# Patient Record
Sex: Male | Born: 1980 | Race: White | Hispanic: No | Marital: Single | Smoking: Never smoker
Health system: Southern US, Community
[De-identification: ages and names within clinical notes are randomized; demographics above are authoritative.]

---

## 2017-09-14 ENCOUNTER — Emergency Department (HOSPITAL_BASED_OUTPATIENT_CLINIC_OR_DEPARTMENT_OTHER): Payer: Commercial Managed Care - PPO

## 2017-09-14 ENCOUNTER — Emergency Department (HOSPITAL_BASED_OUTPATIENT_CLINIC_OR_DEPARTMENT_OTHER)
Admission: EM | Admit: 2017-09-14 | Discharge: 2017-09-14 | Disposition: A | Discharge status: Home / Self Care | Payer: Commercial Managed Care - PPO | Attending: Emergency Medicine | Admitting: Emergency Medicine

## 2017-09-14 ENCOUNTER — Encounter (HOSPITAL_BASED_OUTPATIENT_CLINIC_OR_DEPARTMENT_OTHER): Payer: Self-pay | Admitting: Emergency Medicine

## 2017-09-14 DIAGNOSIS — Y9389 Activity, other specified: Secondary | ICD-10-CM | POA: Diagnosis not present

## 2017-09-14 DIAGNOSIS — M79642 Pain in left hand: Secondary | ICD-10-CM | POA: Insufficient documentation

## 2017-09-14 DIAGNOSIS — S20312A Abrasion of left front wall of thorax, initial encounter: Secondary | ICD-10-CM | POA: Diagnosis not present

## 2017-09-14 DIAGNOSIS — M791 Myalgia, unspecified site: Secondary | ICD-10-CM | POA: Insufficient documentation

## 2017-09-14 DIAGNOSIS — Y999 Unspecified external cause status: Secondary | ICD-10-CM | POA: Diagnosis not present

## 2017-09-14 DIAGNOSIS — S60811A Abrasion of right wrist, initial encounter: Secondary | ICD-10-CM | POA: Diagnosis not present

## 2017-09-14 DIAGNOSIS — S80211A Abrasion, right knee, initial encounter: Secondary | ICD-10-CM | POA: Insufficient documentation

## 2017-09-14 DIAGNOSIS — M25531 Pain in right wrist: Secondary | ICD-10-CM

## 2017-09-14 DIAGNOSIS — Y92410 Unspecified street and highway as the place of occurrence of the external cause: Secondary | ICD-10-CM | POA: Diagnosis not present

## 2017-09-14 DIAGNOSIS — S6991XA Unspecified injury of right wrist, hand and finger(s), initial encounter: Secondary | ICD-10-CM | POA: Diagnosis present

## 2017-09-14 MED ORDER — IBUPROFEN 600 MG PO TABS: 600.0000 mg | ORAL_TABLET | Freq: Four times a day (QID) | ORAL | 0 refills | Status: AC | PRN

## 2017-09-14 MED ORDER — KETOROLAC TROMETHAMINE 30 MG/ML IJ SOLN
15.0000 mg | Freq: Once | INTRAMUSCULAR | Status: AC
  Administered 2017-09-14: 15 mg via INTRAMUSCULAR
  Filled 2017-09-14: qty 1

## 2017-09-14 NOTE — ED Notes (Signed)
Pt discharged to home with family. NAD.  
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
## 2017-11-22 ENCOUNTER — Ambulatory Visit: Payer: Commercial Managed Care - PPO | Admitting: Family Medicine

## 2017-11-22 ENCOUNTER — Ambulatory Visit (HOSPITAL_BASED_OUTPATIENT_CLINIC_OR_DEPARTMENT_OTHER)
Admission: RE | Admit: 2017-11-22 | Discharge: 2017-11-22 | Disposition: A | Discharge status: Home / Self Care | Payer: Commercial Managed Care - PPO | Source: Ambulatory Visit | Attending: Family Medicine | Admitting: Family Medicine

## 2017-11-22 ENCOUNTER — Encounter: Payer: Self-pay | Admitting: Family Medicine

## 2017-11-22 VITALS — BP 134/86 | HR 81 | Ht 69.0 in | Wt 123.0 lb

## 2017-11-22 DIAGNOSIS — S6992XA Unspecified injury of left wrist, hand and finger(s), initial encounter: Secondary | ICD-10-CM

## 2017-11-22 DIAGNOSIS — M25542 Pain in joints of left hand: Secondary | ICD-10-CM | POA: Diagnosis not present

## 2017-11-22 DIAGNOSIS — S6992XD Unspecified injury of left wrist, hand and finger(s), subsequent encounter: Secondary | ICD-10-CM | POA: Insufficient documentation
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
## 2017-11-22 NOTE — Assessment & Plan Note (Signed)
independently reviewed radiographs and small defect noted - confirmed with ultrasound and noted small avulsion fracture ulnar side of proximal phalanx.  Do not think at this point bracing would be beneficial and fragment very small - operative intervention unlikely to be helpful.  Recommended occupational therapy - he would like to try some home exercises first.  Icing, tylenol, ibuprofen if needed.  F/u in 6 weeks or prn.
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
## 2018-03-01 IMAGING — DX DG HAND COMPLETE 3+V*R*
3 series · 3 of 3 positions shown · non-contrast
Comparison: None.

CLINICAL DATA: Right wrist and hand pain.

EXAM:
RIGHT HAND - COMPLETE 3+ VIEW

[hand pa]
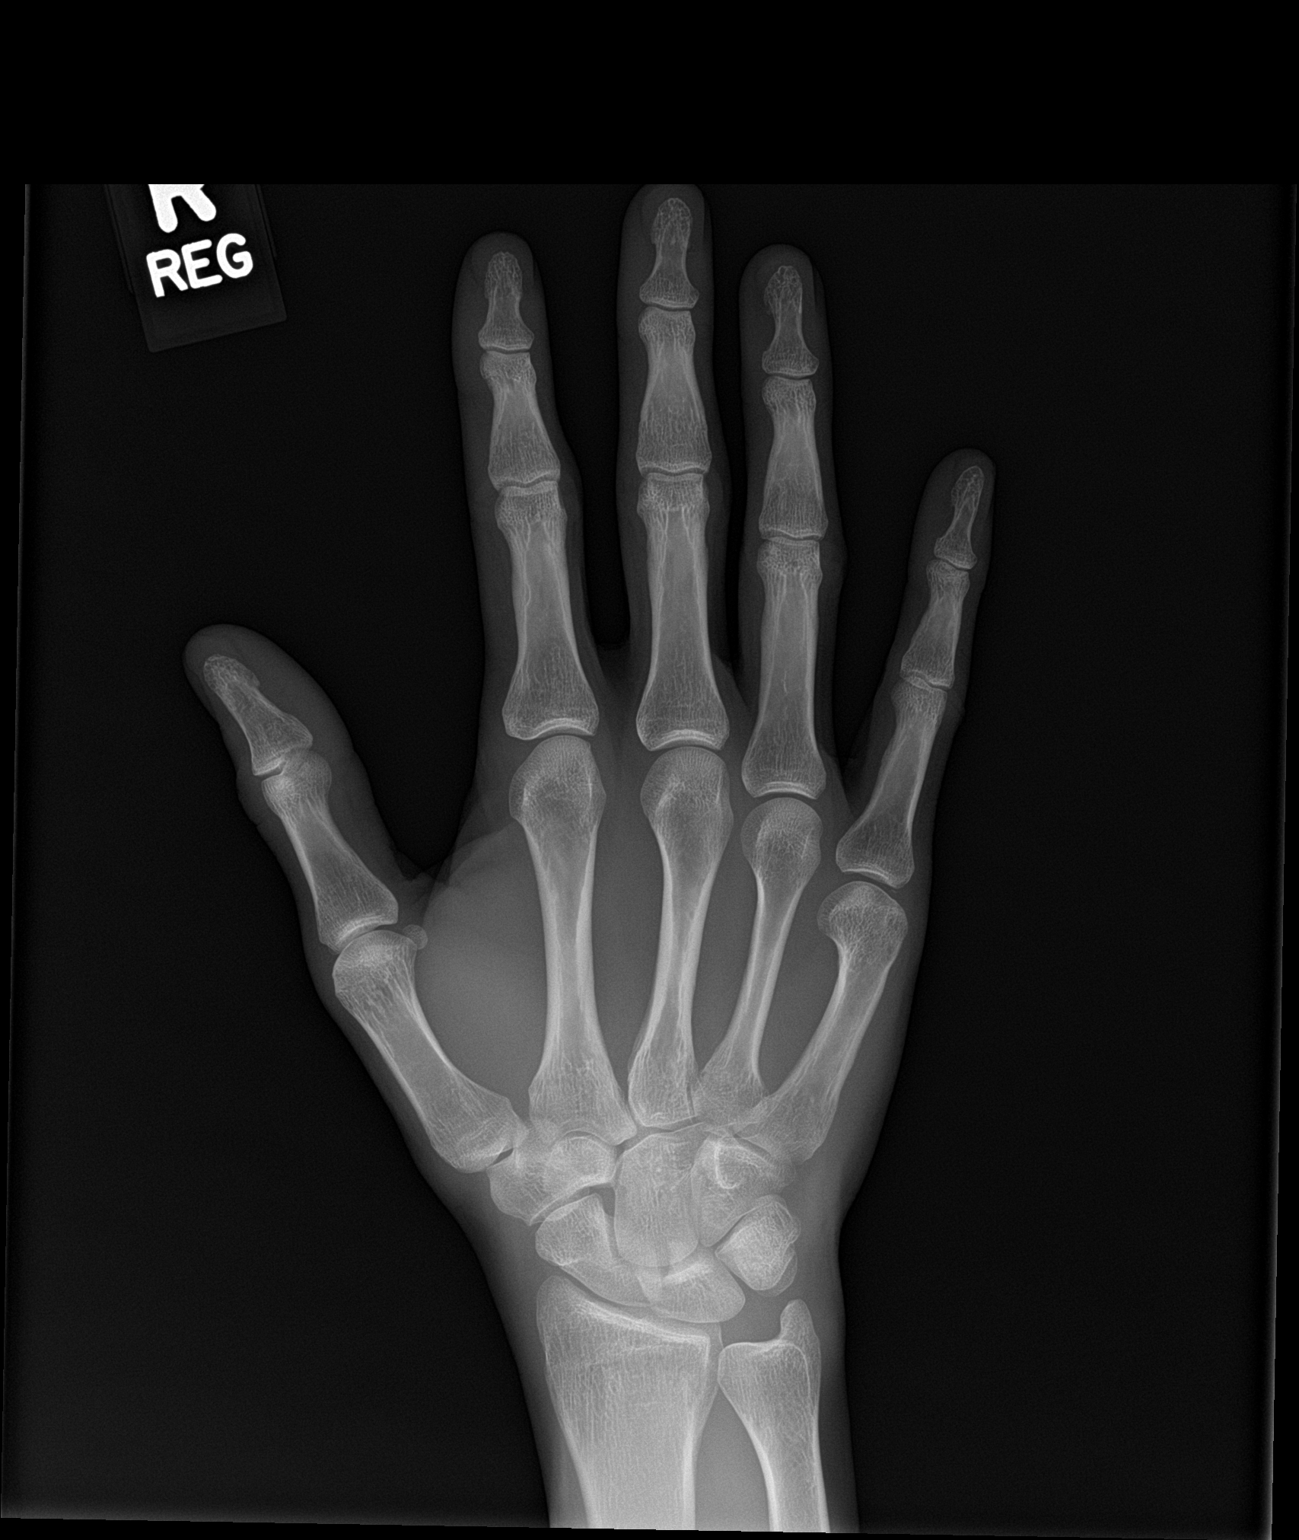

[hand obl]
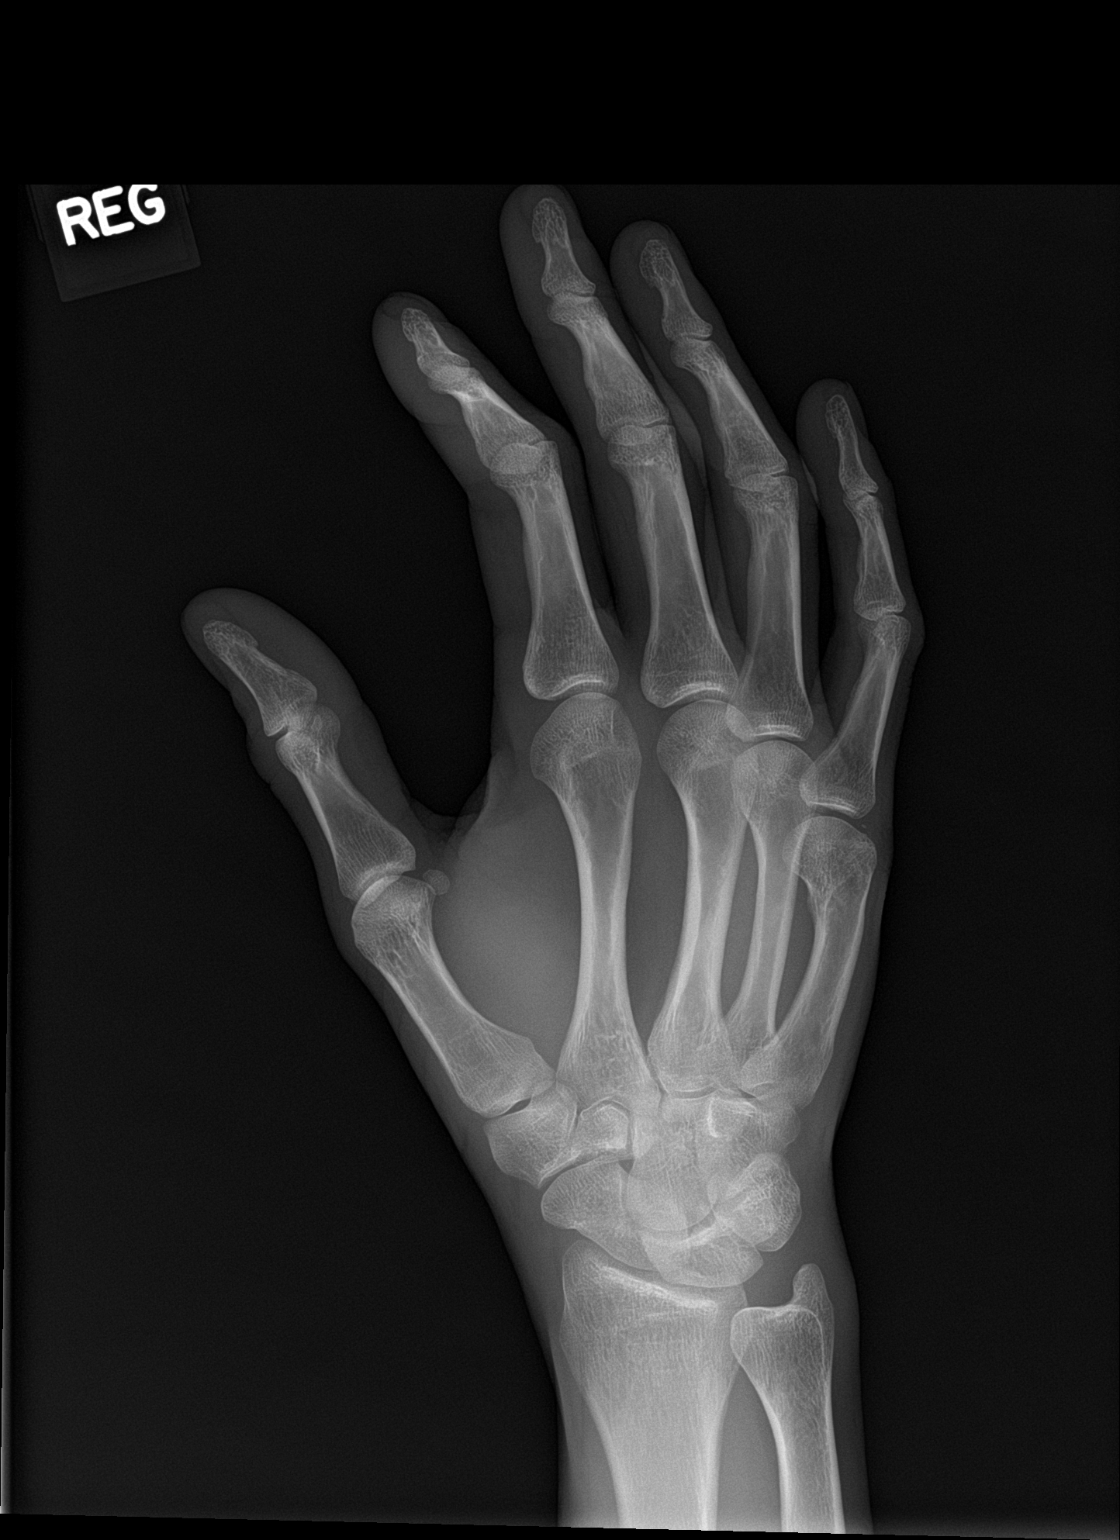

[hand lat]
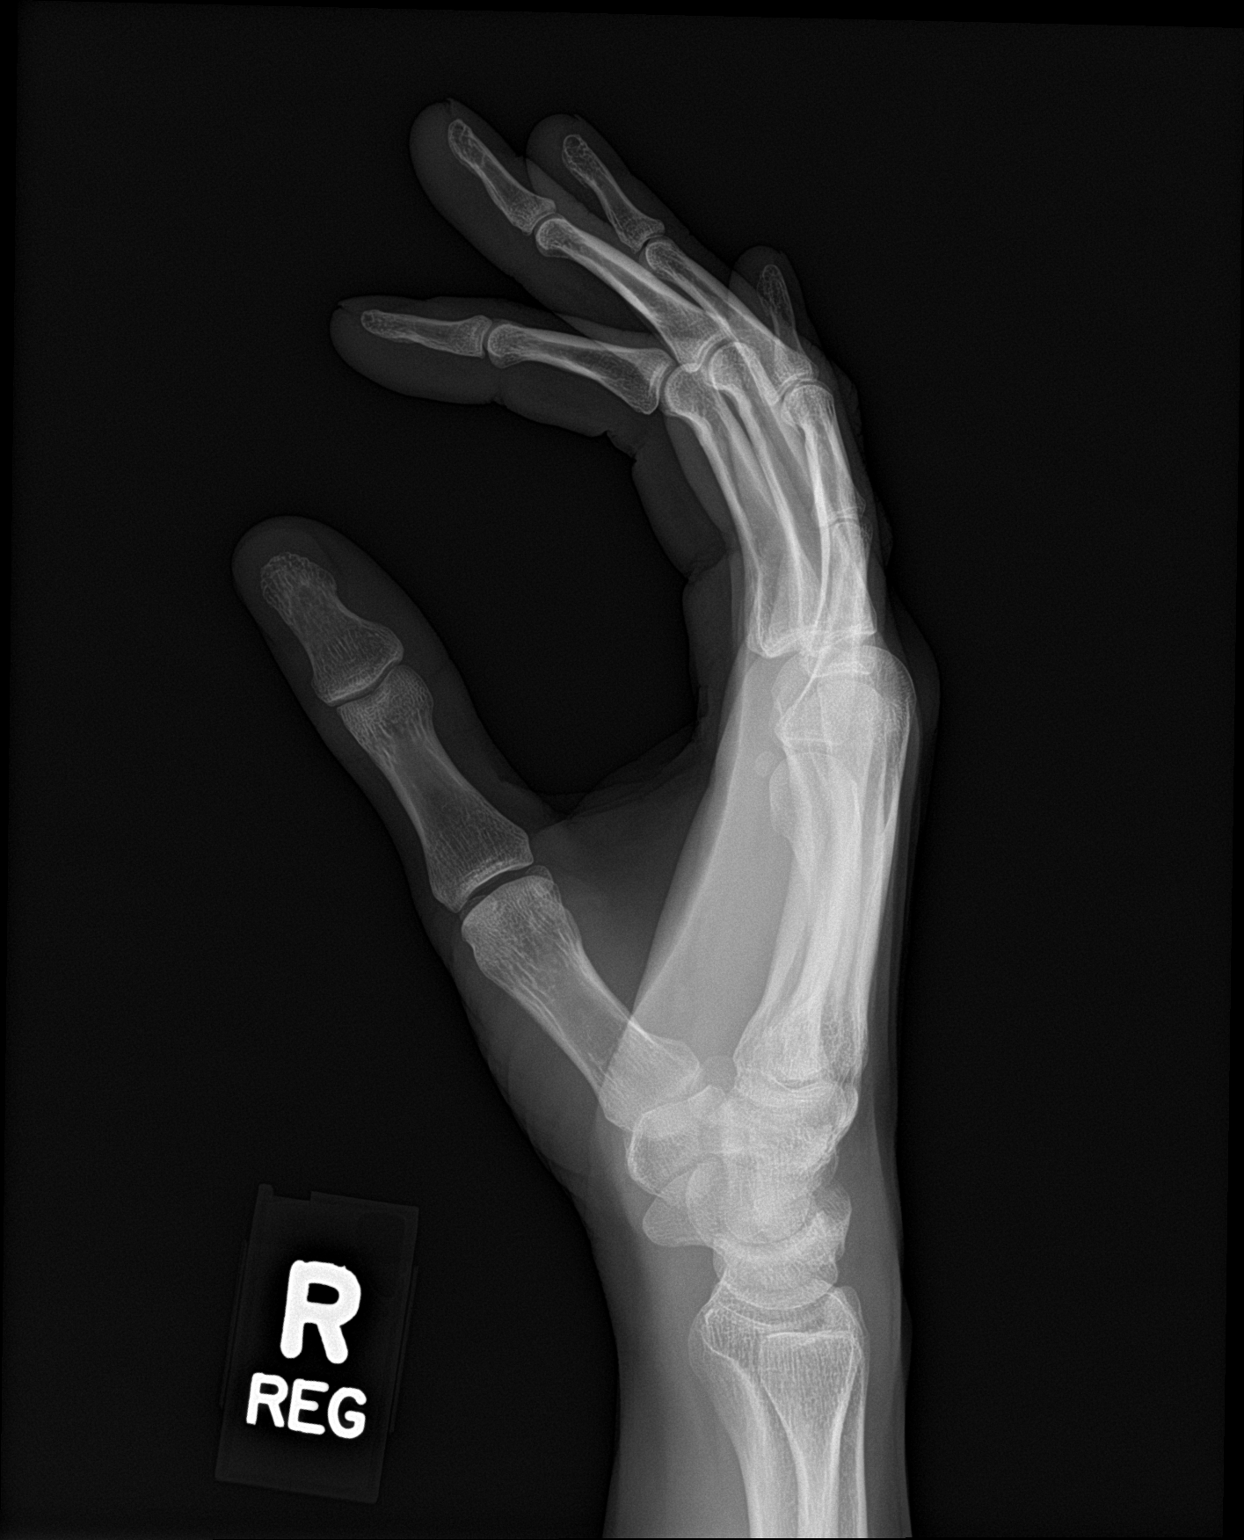

[3 of 3 positions shown; findings below may reference images not displayed]

FINDINGS: There is no evidence of fracture or dislocation. There is no
evidence of arthropathy or other focal bone abnormality. Soft
tissues are unremarkable.
IMPRESSION: Negative.
